# Patient Record
Sex: Female | Born: 1965 | Race: White | Hispanic: Yes | Smoking: Never smoker
Health system: Southern US, Community
[De-identification: ages and names within clinical notes are randomized; demographics above are authoritative.]

## PROBLEM LIST (undated history)

## (undated) DIAGNOSIS — I10 Essential (primary) hypertension: Secondary | ICD-10-CM

## (undated) HISTORY — DX: Essential (primary) hypertension: I10

---

## 1999-12-23 ENCOUNTER — Emergency Department (HOSPITAL_COMMUNITY): Admission: EM | Admit: 1999-12-23 | Discharge: 1999-12-23 | Payer: Self-pay | Admitting: *Deleted

## 2000-03-10 ENCOUNTER — Emergency Department (HOSPITAL_COMMUNITY): Admission: EM | Admit: 2000-03-10 | Discharge: 2000-03-10 | Payer: Self-pay

## 2001-03-21 ENCOUNTER — Emergency Department (HOSPITAL_COMMUNITY): Admission: EM | Admit: 2001-03-21 | Discharge: 2001-03-21 | Payer: Self-pay | Admitting: Emergency Medicine

## 2002-02-08 ENCOUNTER — Emergency Department (HOSPITAL_COMMUNITY): Admission: EM | Admit: 2002-02-08 | Discharge: 2002-02-08 | Payer: Self-pay | Admitting: Emergency Medicine

## 2002-02-22 ENCOUNTER — Other Ambulatory Visit: Admission: RE | Admit: 2002-02-22 | Discharge: 2002-02-22 | Payer: Self-pay | Admitting: Obstetrics and Gynecology

## 2003-10-10 ENCOUNTER — Inpatient Hospital Stay (HOSPITAL_COMMUNITY): Admission: AD | Admit: 2003-10-10 | Discharge: 2003-10-10 | Payer: Self-pay | Admitting: Gynecology

## 2004-07-31 ENCOUNTER — Emergency Department (HOSPITAL_COMMUNITY): Admission: EM | Admit: 2004-07-31 | Discharge: 2004-07-31 | Payer: Self-pay | Admitting: Emergency Medicine

## 2005-09-16 ENCOUNTER — Emergency Department (HOSPITAL_COMMUNITY): Admission: EM | Admit: 2005-09-16 | Discharge: 2005-09-17 | Payer: Self-pay | Admitting: Emergency Medicine

## 2005-12-17 ENCOUNTER — Emergency Department (HOSPITAL_COMMUNITY): Admission: EM | Admit: 2005-12-17 | Discharge: 2005-12-17 | Payer: Self-pay | Admitting: Emergency Medicine

## 2007-03-18 ENCOUNTER — Emergency Department (HOSPITAL_COMMUNITY): Admission: EM | Admit: 2007-03-18 | Discharge: 2007-03-18 | Payer: Self-pay | Admitting: Emergency Medicine

## 2008-01-22 ENCOUNTER — Emergency Department (HOSPITAL_COMMUNITY): Admission: EM | Admit: 2008-01-22 | Discharge: 2008-01-22 | Payer: Self-pay | Admitting: Emergency Medicine

## 2011-04-18 LAB — CBC
HCT: 39.8
Hemoglobin: 13.2
MCHC: 33.2
MCV: 77.7 — ABNORMAL LOW
RBC: 5.12 — ABNORMAL HIGH
RDW: 13.9

## 2011-04-18 LAB — URINALYSIS, ROUTINE W REFLEX MICROSCOPIC
Bilirubin Urine: NEGATIVE
Hgb urine dipstick: NEGATIVE
Ketones, ur: NEGATIVE
Specific Gravity, Urine: 1.003 — ABNORMAL LOW
pH: 7

## 2011-04-18 LAB — COMPREHENSIVE METABOLIC PANEL
AST: 22
Alkaline Phosphatase: 54
CO2: 28
Chloride: 99
GFR calc non Af Amer: >60
Glucose, Bld: 106 — ABNORMAL HIGH
Sodium: 135

## 2011-04-18 LAB — DIFFERENTIAL
Basophils Absolute: 0
Basophils Relative: 0
Lymphocytes Relative: 10 — ABNORMAL LOW
Lymphs Abs: 1.6
Neutro Abs: 13.3 — ABNORMAL HIGH
Neutrophils Relative %: 83 — ABNORMAL HIGH

## 2011-04-18 LAB — RAPID STREP SCREEN (MED CTR MEBANE ONLY): Streptococcus, Group A Screen (Direct): NEGATIVE

## 2011-04-18 LAB — URINE CULTURE: Culture: NO GROWTH

## 2015-06-07 ENCOUNTER — Ambulatory Visit (INDEPENDENT_AMBULATORY_CARE_PROVIDER_SITE_OTHER): Payer: 59 | Admitting: Family Medicine

## 2015-06-07 ENCOUNTER — Ambulatory Visit (INDEPENDENT_AMBULATORY_CARE_PROVIDER_SITE_OTHER): Payer: 59

## 2015-06-07 VITALS — BP 116/70 | HR 65 | Temp 98.3°F | Resp 18 | Ht 64.75 in | Wt 187.8 lb

## 2015-06-07 DIAGNOSIS — R531 Weakness: Secondary | ICD-10-CM | POA: Diagnosis not present

## 2015-06-07 DIAGNOSIS — R319 Hematuria, unspecified: Secondary | ICD-10-CM

## 2015-06-07 DIAGNOSIS — R760 Raised antibody titer: Secondary | ICD-10-CM | POA: Diagnosis not present

## 2015-06-07 DIAGNOSIS — J209 Acute bronchitis, unspecified: Secondary | ICD-10-CM | POA: Diagnosis not present

## 2015-06-07 DIAGNOSIS — M791 Myalgia, unspecified site: Secondary | ICD-10-CM

## 2015-06-07 DIAGNOSIS — R05 Cough: Secondary | ICD-10-CM | POA: Diagnosis not present

## 2015-06-07 DIAGNOSIS — I10 Essential (primary) hypertension: Secondary | ICD-10-CM

## 2015-06-07 DIAGNOSIS — R059 Cough, unspecified: Secondary | ICD-10-CM

## 2015-06-07 DIAGNOSIS — R768 Other specified abnormal immunological findings in serum: Secondary | ICD-10-CM

## 2015-06-07 LAB — POCT URINALYSIS DIP (MANUAL ENTRY)
Bilirubin, UA: NEGATIVE
Glucose, UA: NEGATIVE
Ketones, POC UA: NEGATIVE
Leukocytes, UA: NEGATIVE
Nitrite, UA: NEGATIVE
Protein Ur, POC: NEGATIVE
Spec Grav, UA: 1.025
Urobilinogen, UA: 0.2
pH, UA: 5

## 2015-06-07 LAB — POC MICROSCOPIC URINALYSIS (UMFC): Mucus: ABSENT

## 2015-06-07 MED ORDER — ALBUTEROL SULFATE 108 (90 BASE) MCG/ACT IN AEPB: 2.0000 | INHALATION_SPRAY | Freq: Four times a day (QID) | RESPIRATORY_TRACT | Status: DC | PRN

## 2015-06-07 MED ORDER — PREDNISONE 20 MG PO TABS: ORAL_TABLET | ORAL | Status: DC

## 2015-06-07 MED ORDER — AMLODIPINE BESYLATE 2.5 MG PO TABS: 2.5000 mg | ORAL_TABLET | Freq: Every day | ORAL | Status: DC

## 2015-06-07 MED ORDER — AZITHROMYCIN 250 MG PO TABS: ORAL_TABLET | ORAL | Status: DC

## 2015-06-07 MED ORDER — HYDROCODONE-HOMATROPINE 5-1.5 MG/5ML PO SYRP: 5.0000 mL | ORAL_SOLUTION | Freq: Three times a day (TID) | ORAL | Status: DC | PRN

## 2015-06-07 NOTE — Progress Notes (Signed)
 @  This chart was scribed for Tonya Sidle, MD by Tonya Saunders, ED Scribe. This patient was seen in room 8 and the patient's care was started at 9:14 PM.  Patient ID: Tonya Saunders MRN: [id number], DOB: 08/03/1965, 49 y.o. Date of Encounter: 06/07/2015, 9:14 PM  Primary Physician: No PCP Per Patient  Chief Complaint:   Chief Complaint  Patient presents with  . Cough    x2 months, cough is worse at night   . Fatigue    x2 months  . Burning Sensation    HPI: 49 y.o. year old female with history below presents with gradually worsening, violent cough for 3 month. Cough is worse at night and at time she coughs so hard that she vomits. She started taking left over amoxicillin last week every 8 hours but states she missed a couple of doses and tried restarting medication yesterday . She also reports fatigue as well as burning chest pain and back pain from coughing. She has hx of pneumonia.    at the end of the interview and as I was discharged to patient, she stated that she's been having some hematuria since her last period on October 22. She's having no discharge or pain.   She also complains about having had H. Pylori in the past and wants to make sure she is eradicated the infection.   Past Medical History  Diagnosis Date  . Hypertension      Home Meds: Prior to Admission medications   Medication Sig Start Date End Date Taking? Authorizing Provider  amoxicillin (AMOXIL) 500 MG capsule Take 500 mg by mouth 3 (three) times daily.   Yes Historical Provider, MD  enalapril (VASOTEC) 20 MG tablet Take 20 mg by mouth daily.   Yes Historical Provider, MD    Allergies: No Known Allergies  Social History   Social History  . Marital Status: Divorced    Spouse Name: Tonya Saunders  . Number of Children: Tonya Saunders  . Years of Education: Tonya Saunders   Occupational History  . Not on file.   Social History Main Topics  . Smoking status: Never Smoker   . Smokeless tobacco: Not on file  . Alcohol  Use: No  . Drug Use: No  . Sexual Activity: Not on file   Other Topics Concern  . Not on file   Social History Narrative  . No narrative on file    Review of Systems: Constitutional: negative for chills, fever, night sweats, weight changes, or fatigue  HEENT: negative for vision changes, hearing loss, congestion, rhinorrhea, ST, epistaxis, or sinus pressure Cardiovascular: negative for  palpitations Respiratory: negative for hemoptysis, wheezing, shortness of breath. Abdominal: negative for abdominal pain, nausea, vomiting, diarrhea, or constipation Dermatological: negative for rash Neurologic: negative for headache, dizziness, or syncope All other systems reviewed and are otherwise negative with the exception to those above and in the HPI.   Physical Exam: Blood pressure 116/70, pulse 65, temperature 98.3 F (36.8 C), temperature source Oral, resp. rate 18, height 5' 4.75" (1.645 m), weight 187 lb 12.8 oz (85.186 kg), last menstrual period 06/07/2015, SpO2 96 %., Body mass index is 31.48 kg/(m^2). General: Well developed, well nourished, in no acute distress. Head: Normocephalic, atraumatic, eyes without discharge, sclera non-icteric, nares are without discharge. Bilateral auditory canals clear, TM's are without perforation, pearly grey and translucent with reflective cone of light bilaterally. Oral cavity moist, posterior pharynx without exudate, erythema, peritonsillar abscess, or post nasal drip.  Neck: Supple. No thyromegaly. Full ROM.  No lymphadenopathy. Lungs: Clear bilaterally to auscultation without wheezes, rales, or rhonchi. Breathing is unlabored. Heart: RRR with S1 S2. No murmurs, rubs, or gallops appreciated. Abdomen: Soft, non-tender, non-distended with normoactive bowel sounds. No hepatomegaly. No rebound/guarding. No obvious abdominal masses. Msk:  Strength and tone normal for age. Extremities/Skin: Warm and dry. No clubbing or cyanosis. No edema. No rashes or  suspicious lesions. Neuro: Alert and oriented X 3. Moves all extremities spontaneously. Gait is normal. CNII-XII grossly in tact. Psych:  Responds to questions appropriately with a normal affect.   [facility] reading (PRIMARY) by Dr. Milus GlazierLauenstein: hyperinflated lung fields, no infiltrate   ASSESSMENT AND PLAN:  49 y.o. year old female with     ICD-9-CM ICD-10-CM   1. Cough 786.2 R05 DG Chest 2 View     Albuterol Sulfate (PROAIR RESPICLICK) 108 (90 BASE) MCG/ACT AEPB     predniSONE (DELTASONE) 20 MG tablet     azithromycin (ZITHROMAX) 250 MG tablet     HYDROcodone-homatropine (HYCODAN) 5-1.5 MG/5ML syrup  2. Myalgia 729.1 M79.1   3. Weakness 780.79 R53.1   4. Acute bronchitis, unspecified organism 466.0 J20.9   5. Hematuria 599.70 R31.9 POCT urinalysis dipstick     POCT Microscopic Urinalysis ([facility])     Urine culture  6. Essential hypertension 401.9 I10 amLODipine (NORVASC) 2.5 MG tablet  7. Helicobacter pylori ab+ 795.79 R76.0 H. pylori breath test   By signing my name below, I, Tonya Saunders, attest that this documentation has been prepared under the direction and in the presence of Tonya SidleKurt Liliani Bobo, MD.  Electronically Signed: Andrew Auaven Saunders, ED Scribe. 06/07/2015. 9:14 PM.  Signed, Tonya SidleKurt Teale Goodgame, MD 06/07/2015 9:14 PM

## 2015-06-07 NOTE — Patient Instructions (Signed)
Bronquitis aguda  (Acute Bronchitis)  La bronquitis es una inflamación de las vías respiratorias que se extienden desde la tráquea hasta los pulmones (bronquios). La inflamación produce la formación de mucosidad. Esto produce tos, que es el síntoma más frecuente de la bronquitis.   Cuando la bronquitis es aguda, generalmente comienza de manera súbita y desaparece luego de un par de semanas. El hábito de fumar, las alergias y el asma pueden empeorar la bronquitis. Los episodios repetidos de bronquitis pueden causar más problemas pulmonares.   CAUSAS  La causa más frecuente de bronquitis aguda es el mismo virus que produce el resfrío. El virus puede propagarse de una persona a la otra (contagioso) a través de la tos y los estornudos, y al tocar objetos contaminados.  SIGNOS Y SÍNTOMAS   · Tos.  · Fiebre.  · Tos con mucosidad.  · Dolores en el cuerpo.  · Congestión en el pecho.  · Escalofríos.  · Falta de aire.  · Dolor de garganta.  DIAGNÓSTICO   La bronquitis aguda en general se diagnostica con un examen físico. El médico también le hará preguntas sobre su historia clínica. En algunos casos se indican otros estudios, como radiografías, para descartar otras enfermedades.   TRATAMIENTO   La bronquitis aguda generalmente desaparece en un par de semanas. Con frecuencia, no es necesario realizar un tratamiento. Los medicamentos se indican para aliviar la fiebre o la tos. Generalmente, no es necesario el uso de antibióticos, pero pueden recetarse en ciertas ocasiones. En algunos casos, se recomienda el uso de un inhalador para mejorar la falta de aire y controlar la tos. Un vaporizador de aire frío podrá ayudarlo a disolver las secreciones bronquiales y facilitar su eliminación.   INSTRUCCIONES PARA EL CUIDADO EN EL HOGAR   · Descanse lo suficiente.  · Beba líquidos en abundancia para mantener la orina de color claro o amarillo pálido (excepto que padezca una enfermedad que requiera la restricción de líquidos). El aumento  de líquidos puede ayudar a que las secreciones respiratorias (esputo) sean menos espesas y a reducir la congestión del pecho, y evitará la deshidratación.  · Tome los medicamentos solamente como se lo haya indicado el médico.  · Si le recetaron antibióticos, asegúrese de terminarlos, incluso si comienza a sentirse mejor.  · Evite fumar o aspirar el humo de otros fumadores. La exposición al humo del cigarrillo o a irritantes químicos hará que la bronquitis empeore. Si fuma, considere el uso de goma de mascar o la aplicación de parches en la piel que contengan nicotina para aliviar los síntomas de abstinencia. Si deja de fumar, sus pulmones se curarán más rápido.  · Reduzca la probabilidad de otro episodio de bronquitis aguda lavando sus manos con frecuencia, evitando a las personas que tengan síntomas y tratando de no tocarse las manos con la boca, la nariz o los ojos.  · Concurra a todas las visitas de control como se lo haya indicado el médico.  SOLICITE ATENCIÓN MÉDICA SI:  Los síntomas no mejoran después de una semana de tratamiento.   SOLICITE ATENCIÓN MÉDICA DE INMEDIATO SI:  · Comienza a tener fiebre o escalofríos cada vez más intensos.  · Siente dolor en el pecho.  · Le falta el aire de manera preocupante.  · La flema tiene sangre.  · Se deshidrata.  · Se desmaya o siente que va a desmayarse de forma repetida.  · Tiene vómitos que se repiten.  · Tiene un dolor de cabeza intenso.  ASEGÚRESE DE QUE:   ·   Comprende estas instrucciones.  · Controlará su afección.  · Recibirá ayuda de inmediato si no mejora o si empeora.     Esta información no tiene como fin reemplazar el consejo del médico. Asegúrese de hacerle al médico cualquier pregunta que tenga.     Document Released: 07/08/2005 Document Revised: 07/29/2014  Elsevier Interactive Patient Education ©2016 Elsevier Inc.

## 2015-06-09 LAB — H. PYLORI BREATH TEST: H. pylori Breath Test: NOT DETECTED

## 2015-06-09 LAB — URINE CULTURE
Colony Count: NO GROWTH
Organism ID, Bacteria: NO GROWTH

## 2015-06-14 ENCOUNTER — Ambulatory Visit (INDEPENDENT_AMBULATORY_CARE_PROVIDER_SITE_OTHER): Payer: 59

## 2015-06-14 ENCOUNTER — Ambulatory Visit (INDEPENDENT_AMBULATORY_CARE_PROVIDER_SITE_OTHER): Payer: 59 | Admitting: Family Medicine

## 2015-06-14 VITALS — BP 122/80 | HR 74 | Temp 98.3°F | Resp 18 | Ht 65.0 in | Wt 185.8 lb

## 2015-06-14 DIAGNOSIS — K5901 Slow transit constipation: Secondary | ICD-10-CM | POA: Diagnosis not present

## 2015-06-14 DIAGNOSIS — R358 Other polyuria: Secondary | ICD-10-CM | POA: Diagnosis not present

## 2015-06-14 DIAGNOSIS — R1013 Epigastric pain: Secondary | ICD-10-CM

## 2015-06-14 DIAGNOSIS — R079 Chest pain, unspecified: Secondary | ICD-10-CM

## 2015-06-14 DIAGNOSIS — R3589 Other polyuria: Secondary | ICD-10-CM

## 2015-06-14 LAB — POCT URINALYSIS DIP (MANUAL ENTRY)
Bilirubin, UA: NEGATIVE
Glucose, UA: NEGATIVE
Ketones, POC UA: NEGATIVE
Nitrite, UA: NEGATIVE
Protein Ur, POC: NEGATIVE
Spec Grav, UA: 1.015
Urobilinogen, UA: 0.2
pH, UA: 8

## 2015-06-14 LAB — POCT CBC
Granulocyte percent: 88.5 %G — AB (ref 37–80)
HCT, POC: 41.8 % (ref 37.7–47.9)
Hemoglobin: 13.6 g/dL (ref 12.2–16.2)
Lymph, poc: 1 (ref 0.6–3.4)
MCH, POC: 25.1 pg — AB (ref 27–31.2)
MCHC: 32.5 g/dL (ref 31.8–35.4)
MCV: 77 fL — AB (ref 80–97)
MID (cbc): 0.5 (ref 0–0.9)
MPV: 7.9 fL (ref 0–99.8)
POC Granulocyte: 11.4 — AB (ref 2–6.9)
POC LYMPH PERCENT: 7.6 %L — AB (ref 10–50)
POC MID %: 3.9 %M (ref 0–12)
Platelet Count, POC: 355 10*3/uL (ref 142–424)
RBC: 5.43 M/uL (ref 4.04–5.48)
RDW, POC: 14.3 %
WBC: 12.9 10*3/uL — AB (ref 4.6–10.2)

## 2015-06-14 LAB — POC MICROSCOPIC URINALYSIS (UMFC): Mucus: ABSENT

## 2015-06-14 MED ORDER — SUCRALFATE 1 GM/10ML PO SUSP: 1.0000 g | Freq: Two times a day (BID) | ORAL | Status: DC

## 2015-06-14 MED ORDER — GI COCKTAIL ~~LOC~~
30.0000 mL | Freq: Once | ORAL | Status: AC
  Administered 2015-06-14: 30 mL via ORAL

## 2015-06-14 MED ORDER — POLYETHYLENE GLYCOL 3350 17 GM/SCOOP PO POWD: 17.0000 g | Freq: Two times a day (BID) | ORAL | Status: DC | PRN

## 2015-06-14 NOTE — Progress Notes (Addendum)
This chart was scribed for Tonya Sidle, MD by Tonya Saunders, medical scribe at [facility].The patient was seen in exam room 1 and the patient's care was started at 5:01 PM.  Patient ID: [name] MRN: [id number], DOB: Apr 17, 1966, 49 y.o. Date of Encounter: 06/14/2015  Primary Physician: No PCP Per Patient  Chief Complaint:  Chief Complaint  Patient presents with  . Follow-up     lab work,    HPI:  Tonya Saunders is a 49 y.o. female who presents to [facility] for lab work follow up.   She noticed some chest pain last night and she can't breathe very well. Her arms feel weak with some abd pain that feels like "heart burn". She feels nauseous in the room today. It hurts more when she's lying down. She's been concerned and has been reading about this. She is concerned that this feels like a heart attack. She also states that she's been urinating more frequently than usual.   She was previously given medication for heart burn, however, patient states she doesn't know how to use it.   Past Medical History  Diagnosis Date  . Hypertension      Home Meds: Prior to Admission medications   Medication Sig Start Date End Date Taking? Authorizing Provider  amLODipine (NORVASC) 2.5 MG tablet Take 1 tablet (2.5 mg total) by mouth daily. 06/07/15  Yes Tonya Sidle, MD  azithromycin (ZITHROMAX) 250 MG tablet Take 2 tabs PO x 1 dose, then 1 tab PO QD x 4 days 06/07/15  Yes Tonya Sidle, MD  HYDROcodone-homatropine [facility]) 5-1.5 MG/5ML syrup Take 5 mLs by mouth every 8 (eight) hours as needed for cough. 06/07/15  Yes Tonya Sidle, MD  predniSONE (DELTASONE) 20 MG tablet Two daily with food 06/07/15  Yes Tonya Sidle, MD  Albuterol Sulfate (PROAIR RESPICLICK) 108 (90 BASE) MCG/ACT AEPB Inhale 2 puffs into the lungs every 6 (six) hours as needed. Patient not taking: Reported on 06/14/2015 06/07/15   Tonya Sidle, MD    Allergies: No  Known Allergies  Social History   Social History  . Marital Status: Divorced    Spouse Name: N/A  . Number of Children: N/A  . Years of Education: N/A   Occupational History  . Not on file.   Social History Main Topics  . Smoking status: Never Smoker   . Smokeless tobacco: Not on file  . Alcohol Use: No  . Drug Use: No  . Sexual Activity: Not on file   Other Topics Concern  . Not on file   Social History Narrative     Review of Systems: Constitutional: negative for fever, chills, night sweats, weight changes; positive for fatigue  HEENT: negative for vision changes, hearing loss, congestion, rhinorrhea, ST, epistaxis, or sinus pressure Cardiovascular: negative for palpitations; positive for chest pain Respiratory: negative for hemoptysis, wheezing, shortness of breath, or cough Abdominal: negative for vomiting, diarrhea, or constipation; positive for abd pain, nausea Dermatological: negative for rash Neurologic: negative for headache, dizziness, or syncope; positive for weakness (upper extremity) GU: positive for urinary frequency All other systems reviewed and are otherwise negative with the exception to those above and in the HPI.  Physical Exam: Blood pressure 122/80, pulse 74, temperature 98.3 F (36.8 C), temperature source Oral, resp. rate 18, height  (1.651 m), weight 185 lb 12.8 oz (84.278 kg), last menstrual period 06/07/2015, SpO2 99 %., Body mass index is 30.92 kg/(m^2). General: Well  developed, well nourished, in no acute distress. Head: Normocephalic, atraumatic, eyes without discharge, sclera non-icteric, nares are without discharge. Bilateral auditory canals clear, TM's are without perforation, pearly grey and translucent with reflective cone of light bilaterally. Oral cavity moist, posterior pharynx without exudate, erythema, peritonsillar abscess, or post nasal drip.  Neck: Supple. No thyromegaly. Full ROM. No lymphadenopathy. Lungs: Clear bilaterally  to auscultation without wheezes, rales, or rhonchi. Breathing is unlabored. Heart: RRR with S1 S2. No murmurs, rubs, or gallops appreciated. Abdomen: Soft, non-tender, non-distended with normoactive bowel sounds. No hepatomegaly. No rebound/guarding. No obvious abdominal masses.  Msk:  Strength and tone normal for age. Extremities/Skin: Warm and dry. No clubbing or cyanosis. No edema. No rashes or suspicious lesions. Neuro: Alert and oriented X 3. Moves all extremities spontaneously. Gait is normal. CNII-XII grossly in tact. Psych:  Responds to questions appropriately with a normal affect.   Labs: Results for orders placed or performed in visit on 06/14/15  POCT urinalysis dipstick  Result Value Ref Range   Color, UA yellow yellow   Clarity, UA clear clear   Glucose, UA negative negative   Bilirubin, UA negative negative   Ketones, POC UA negative negative   Spec Grav, UA 1.015    Blood, UA moderate (A) negative   pH, UA 8.0    Protein Ur, POC negative negative   Urobilinogen, UA 0.2    Nitrite, UA Negative Negative   Leukocytes, UA Trace (A) Negative  POCT Microscopic Urinalysis ([facility])  Result Value Ref Range   WBC,UR,HPF,POC Few (A) None WBC/hpf   RBC,UR,HPF,POC Moderate (A) None RBC/hpf   Bacteria None None, Too numerous to count   Mucus Absent Absent   Epithelial Cells, UR Per Microscopy Few (A) None, Too numerous to count cells/hpf  POCT CBC  Result Value Ref Range   WBC 12.9 (A) 4.6 - 10.2 K/uL   Lymph, poc 1.0 0.6 - 3.4   POC LYMPH PERCENT 7.6 (A) 10 - 50 %L   MID (cbc) 0.5 0 - 0.9   POC MID % 3.9 0 - 12 %M   POC Granulocyte 11.4 (A) 2 - 6.9   Granulocyte percent 88.5 (A) 37 - 80 %G   RBC 5.43 4.04 - 5.48 M/uL   Hemoglobin 13.6 12.2 - 16.2 g/dL   HCT, POC 45.441.8 09.837.7 - 47.9 %   MCV 77.0 (A) 80 - 97 fL   MCH, POC 25.1 (A) 27 - 31.2 pg   MCHC 32.5 31.8 - 35.4 g/dL   RDW, POC 11.914.3 %   Platelet Count, POC 355 142 - 424 K/uL   MPV 7.9 0 - 99.8 fL   EKG:  NSR [facility]  reading (PRIMARY) by  Dr. Milus GlazierLauenstein:  Normal CXR.   ASSESSMENT AND PLAN:  49 y.o. year old female with  This chart was scribed in my presence and reviewed by me personally.    ICD-9-CM ICD-10-CM   1. Abdominal pain, epigastric 789.06 R10.13 POCT CBC     BASIC METABOLIC PANEL WITH GFR     Amylase     DG Chest 2 View     EKG 12-Lead     gi cocktail (Maalox,Lidocaine,Donnatal)     sucralfate (CARAFATE) 1 GM/10ML suspension     Ambulatory referral to Gastroenterology  2. Chest pain, unspecified chest pain type 786.50 R07.9 POCT CBC     BASIC METABOLIC PANEL WITH GFR     Amylase     DG Chest 2 View     EKG  12-Lead     gi cocktail (Maalox,Lidocaine,Donnatal)     sucralfate (CARAFATE) 1 GM/10ML suspension  3. Polyuria 788.42 R35.8 POCT urinalysis dipstick     POCT Microscopic Urinalysis ([facility])     gi cocktail (Maalox,Lidocaine,Donnatal)  4. Slow transit constipation 564.01 K59.01 polyethylene glycol powder (GLYCOLAX/MIRALAX) powder     Ambulatory referral to Gastroenterology     By signing my name below, I, Tonya Saunders, attest that this documentation has been prepared under the direction and in the presence of Tonya Sidle, MD. Electronically Signed: Stann Saunders, Scribe. 06/14/2015 , 6:09 PM .  Signed, Tonya Sidle, MD 06/14/2015 6:09 PM

## 2015-06-16 LAB — BASIC METABOLIC PANEL WITH GFR
BUN: 13 mg/dL (ref 7–25)
CO2: 29 mmol/L (ref 20–31)
Calcium: 9.8 mg/dL (ref 8.6–10.2)
Chloride: 98 mmol/L (ref 98–110)
Creat: 0.88 mg/dL (ref 0.50–1.10)
GFR, Est African American: >89 mL/min (ref >=60)
GFR, Est Non African American: 78 mL/min (ref >=60)
Glucose, Bld: 113 mg/dL — ABNORMAL HIGH (ref 65–99)
Potassium: 4.6 mmol/L (ref 3.5–5.3)
Sodium: 136 mmol/L (ref 135–146)

## 2015-06-16 LAB — AMYLASE: Amylase: 31 U/L (ref 0–105)

## 2015-06-17 ENCOUNTER — Other Ambulatory Visit: Payer: Self-pay | Admitting: Family Medicine

## 2015-06-17 DIAGNOSIS — R1013 Epigastric pain: Principal | ICD-10-CM

## 2015-06-17 DIAGNOSIS — G8929 Other chronic pain: Secondary | ICD-10-CM

## 2015-06-21 ENCOUNTER — Encounter: Payer: Self-pay | Admitting: Gastroenterology

## 2015-08-21 ENCOUNTER — Ambulatory Visit: Payer: Self-pay | Admitting: Gastroenterology

## 2016-01-03 ENCOUNTER — Ambulatory Visit (INDEPENDENT_AMBULATORY_CARE_PROVIDER_SITE_OTHER): Payer: BLUE CROSS/BLUE SHIELD | Admitting: Family Medicine

## 2016-01-03 VITALS — BP 128/80 | HR 67 | Temp 97.9°F | Resp 16 | Ht 64.0 in | Wt 185.4 lb

## 2016-01-03 DIAGNOSIS — R079 Chest pain, unspecified: Secondary | ICD-10-CM

## 2016-01-03 DIAGNOSIS — R5382 Chronic fatigue, unspecified: Secondary | ICD-10-CM | POA: Diagnosis not present

## 2016-01-03 DIAGNOSIS — R059 Cough, unspecified: Secondary | ICD-10-CM

## 2016-01-03 DIAGNOSIS — R300 Dysuria: Secondary | ICD-10-CM | POA: Diagnosis not present

## 2016-01-03 DIAGNOSIS — R05 Cough: Secondary | ICD-10-CM | POA: Diagnosis not present

## 2016-01-03 DIAGNOSIS — K219 Gastro-esophageal reflux disease without esophagitis: Secondary | ICD-10-CM | POA: Diagnosis not present

## 2016-01-03 DIAGNOSIS — R1013 Epigastric pain: Secondary | ICD-10-CM | POA: Diagnosis not present

## 2016-01-03 LAB — BASIC METABOLIC PANEL WITH GFR
BUN: 13 mg/dL (ref 7–25)
CO2: 25 mmol/L (ref 20–31)
Calcium: 9.4 mg/dL (ref 8.6–10.2)
Chloride: 100 mmol/L (ref 98–110)
Creat: 0.86 mg/dL (ref 0.50–1.10)
GFR, Est African American: >89 mL/min (ref >=60)
GFR, Est Non African American: 80 mL/min (ref >=60)
Glucose, Bld: 86 mg/dL (ref 65–99)
Potassium: 4.6 mmol/L (ref 3.5–5.3)
Sodium: 135 mmol/L (ref 135–146)

## 2016-01-03 LAB — POC MICROSCOPIC URINALYSIS (UMFC): Mucus: ABSENT

## 2016-01-03 LAB — POCT CBC
Granulocyte percent: 75.1 %G (ref 37–80)
HCT, POC: 38.1 % (ref 37.7–47.9)
Hemoglobin: 12.9 g/dL (ref 12.2–16.2)
Lymph, poc: 2 (ref 0.6–3.4)
MCH, POC: 25.3 pg — AB (ref 27–31.2)
MCHC: 33.8 g/dL (ref 31.8–35.4)
MCV: 74.7 fL — AB (ref 80–97)
MID (cbc): 0.5 (ref 0–0.9)
MPV: 8.3 fL (ref 0–99.8)
POC Granulocyte: 7.7 — AB (ref 2–6.9)
POC LYMPH PERCENT: 19.9 %L (ref 10–50)
POC MID %: 5 %M (ref 0–12)
Platelet Count, POC: 262 10*3/uL (ref 142–424)
RBC: 5.09 M/uL (ref 4.04–5.48)
RDW, POC: 14.7 %
WBC: 10.2 10*3/uL (ref 4.6–10.2)

## 2016-01-03 LAB — POCT URINALYSIS DIP (MANUAL ENTRY)
Bilirubin, UA: NEGATIVE
Blood, UA: NEGATIVE
Glucose, UA: NEGATIVE
Ketones, POC UA: NEGATIVE
Nitrite, UA: NEGATIVE
Protein Ur, POC: NEGATIVE
Spec Grav, UA: 1.015
Urobilinogen, UA: 0.2
pH, UA: 5.5

## 2016-01-03 MED ORDER — HYDROCOD POLST-CPM POLST ER 10-8 MG/5ML PO SUER: 5.0000 mL | Freq: Every evening | ORAL | Status: DC | PRN

## 2016-01-03 MED ORDER — NITROFURANTOIN MONOHYD MACRO 100 MG PO CAPS: 100.0000 mg | ORAL_CAPSULE | Freq: Two times a day (BID) | ORAL | Status: DC

## 2016-01-03 MED ORDER — SUCRALFATE 1 GM/10ML PO SUSP
1.0000 g | Freq: Two times a day (BID) | ORAL | Status: DC
Start: 2016-01-03 — End: 2016-02-13

## 2016-01-03 MED ORDER — ALBUTEROL SULFATE 108 (90 BASE) MCG/ACT IN AEPB: 2.0000 | INHALATION_SPRAY | Freq: Four times a day (QID) | RESPIRATORY_TRACT | Status: DC | PRN

## 2016-01-03 NOTE — Patient Instructions (Signed)
I am refilling your medicine for acid reduction which should help with the cough. I'm also prescribing an antibiotic for urinary infection.  I'm waiting for your thyroid test to come back to see if the thyroid is causing the chronic fatigue.

## 2016-01-03 NOTE — Progress Notes (Signed)
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 Chronic cough x 5 months with minimal phlegm, no shortness of bbreath, no fever and mild improvement with inhaler 2. Intermittent substernal tightness and heart burn 3. dysuria Objective:  BP 128/80 mmHg  Pulse 67  Temp(Src) 97.9 F (36.6 C) (Oral)  Resp 16  Ht 5\' 4"  (1.626 m)  Wt 185 lb 6.4 oz (84.097 kg)  BMI 31.81 kg/m2  SpO2 98%  LMP 12/09/2015 HEENT: unremarkable Chest:  Clear Heart:  Reg, no murmur or gallop Skin:  No rash  Results for orders placed or performed in visit on 01/03/16  POCT CBC  Result Value Ref Range   WBC 10.2 4.6 - 10.2 K/uL   Lymph, poc 2.0 0.6 - 3.4   POC LYMPH PERCENT 19.9 10 - 50 %L   MID (cbc) 0.5 0 - 0.9   POC MID % 5.0 0 - 12 %M   POC Granulocyte 7.7 (A) 2 - 6.9   Granulocyte percent 75.1 37 - 80 %G   RBC 5.09 4.04 - 5.48 M/uL   Hemoglobin 12.9 12.2 - 16.2 g/dL   HCT, POC 16.138.1 09.637.7 - 47.9 %   MCV 74.7 (A) 80 - 97 fL   MCH, POC 25.3 (A) 27 - 31.2 pg   MCHC 33.8 31.8 - 35.4 g/dL   RDW, POC 04.514.7 %   Platelet Count, POC 262 142 - 424 K/uL   MPV 8.3 0 - 99.8 fL  POCT urinalysis dipstick  Result Value Ref Range   Color, UA yellow yellow   Clarity, UA clear clear   Glucose, UA negative negative   Bilirubin, UA negative negative   Ketones, POC UA negative negative   Spec Grav, UA 1.015    Blood, UA negative negative   pH, UA 5.5    Protein Ur, POC negative negative   Urobilinogen, UA 0.2    Nitrite, UA Negative Negative   Leukocytes, UA small (1+) (A) Negative  POCT Microscopic Urinalysis ([facility])  Result Value Ref Range   WBC,UR,HPF,POC None None WBC/hpf   RBC,UR,HPF,POC None None RBC/hpf   Bacteria None None, Too numerous to count   Mucus Absent Absent   Epithelial Cells, UR Per Microscopy Few (A) None, Too numerous to count cells/hpf   Assessment:There are features of reflux that could be contributing to her cough and substernal pain. A chronic fatigue is a little  more difficult to explain.      ICD-9-CM ICD-10-CM   1. Chronic fatigue 780.79 R53.82 POCT CBC     BASIC METABOLIC PANEL WITH GFR     TSH  2. Cough 786.2 R05 POCT CBC     Albuterol Sulfate (PROAIR RESPICLICK) 108 (90 Base) MCG/ACT AEPB     chlorpheniramine-HYDROcodone (TUSSIONEX PENNKINETIC ER) 10-8 MG/5ML SUER  3. Gastroesophageal reflux disease, esophagitis presence not specified 530.81 K21.9 sucralfate (CARAFATE) 1 GM/10ML suspension  4. Abdominal pain, epigastric 789.06 R10.13 sucralfate (CARAFATE) 1 GM/10ML suspension  5. Chest pain, unspecified chest pain type 786.50 R07.9 sucralfate (CARAFATE) 1 GM/10ML suspension  6. Dysuria 788.1 R30.0 POCT urinalysis dipstick     POCT Microscopic Urinalysis ([facility])     Urine culture     nitrofurantoin, macrocrystal-monohydrate, (MACROBID) 100 MG capsule     Signed, Elvina SidleKurt Lakyn Mantione, MD

## 2016-01-04 ENCOUNTER — Telehealth: Payer: Self-pay | Admitting: Emergency Medicine

## 2016-01-04 ENCOUNTER — Other Ambulatory Visit: Payer: Self-pay | Admitting: Family Medicine

## 2016-01-04 DIAGNOSIS — R053 Chronic cough: Secondary | ICD-10-CM

## 2016-01-04 DIAGNOSIS — R05 Cough: Secondary | ICD-10-CM

## 2016-01-04 LAB — TSH: TSH: 1.2 mIU/L

## 2016-01-04 NOTE — Telephone Encounter (Signed)
-----   Message from Elvina SidleKurt Lauenstein, MD sent at 01/04/2016 11:44 AM EDT ----- Please inform patient of normal result

## 2016-01-04 NOTE — Telephone Encounter (Signed)
Pt given normal blood results 

## 2016-01-05 ENCOUNTER — Telehealth: Payer: Self-pay

## 2016-01-05 NOTE — Telephone Encounter (Signed)
Pharm sent notice that sucralfate suspension is not covered by insurance. Called pt to see if she has ever tried the tablets, and she reported that she has not. I advised her that I would check w/pharm to see if ins will cover it in tablet form. Called pharm who changed Rx to the tablet form and it was covered by ins w/payment of $6. I advised pt that if the tablets do not work as well as the suspension to call me and at that point I might be able to get ins to cover it (after failing tablets).

## 2016-02-13 ENCOUNTER — Ambulatory Visit (INDEPENDENT_AMBULATORY_CARE_PROVIDER_SITE_OTHER): Payer: BLUE CROSS/BLUE SHIELD | Admitting: Internal Medicine

## 2016-02-13 ENCOUNTER — Encounter: Payer: Self-pay | Admitting: Internal Medicine

## 2016-02-13 VITALS — BP 142/80 | HR 54 | Ht 68.0 in | Wt 185.4 lb

## 2016-02-13 DIAGNOSIS — R05 Cough: Secondary | ICD-10-CM

## 2016-02-13 DIAGNOSIS — R058 Other specified cough: Secondary | ICD-10-CM | POA: Insufficient documentation

## 2016-02-13 MED ORDER — PANTOPRAZOLE SODIUM 40 MG PO TBEC: 40.0000 mg | DELAYED_RELEASE_TABLET | Freq: Every day | ORAL | 2 refills | Status: AC

## 2016-02-13 MED ORDER — FAMOTIDINE 20 MG PO TABS: ORAL_TABLET | ORAL | 2 refills | Status: AC

## 2016-02-13 MED ORDER — TRAMADOL HCL 50 MG PO TABS: ORAL_TABLET | ORAL | 0 refills | Status: AC

## 2016-02-13 MED ORDER — METHYLPREDNISOLONE ACETATE 80 MG/ML IJ SUSP
120.0000 mg | Freq: Once | INTRAMUSCULAR | Status: AC
Start: 2016-02-13 — End: 2016-02-13
  Administered 2016-02-13: 120 mg via INTRAMUSCULAR

## 2016-02-13 NOTE — Progress Notes (Signed)
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 Pulmonary Consult    Referred by Dr. Milus Glazier. Pt c/o CP, SOB and cough with clear sputum off and on for the past month. Her cough is esp worse at night.   cough x 03/2015 worse at hs sometimes wakes her up around 2am and not typically at time to get up / occ coughs  so hard she vomits ? Some better from inhalers but really minimal sputum production and denies  purulent sputum or mucus plugs or h/o hemoptysis   Also with urinary incont with cough  Assoc midline Ant dull  Cp lasts from 1 min / longest 1 h worse with cough/sometime when lies down has it even if not coughing   Also lat cp with cough both flanks  No obvious other patterns in day to day or daytime variabilty or assoc  or chest tightness, subjective wheeze overt sinus or hb symptoms. No unusual exp hx or h/o childhood pna/ asthma or knowledge of premature birth.   Also denies any obvious fluctuation of symptoms with weather or environmental changes or other aggravating or alleviating factors except as outlined above   Current Medications, Allergies, Complete Past Medical History, Past Surgical History, Family History, and Social History were reviewed in [employer] record.                Review of Systems  Constitutional: Negative for chills, fever and unexpected weight change.  HENT: Positive for congestion, sneezing, sore throat and voice change. Negative for dental problem, ear pain, nosebleeds, postnasal drip, rhinorrhea, sinus pressure and trouble swallowing.   Eyes: Negative for visual disturbance.  Respiratory: Positive for cough and choking.  Negative for shortness of breath.   Cardiovascular: Positive for chest pain. Negative for leg swelling.  Gastrointestinal: Negative for abdominal pain, diarrhea and vomiting.       Acid heartburn  Genitourinary: Negative for difficulty urinating.  Musculoskeletal: Negative for arthralgias.  Skin: Negative for rash.  Neurological: Positive for headaches. Negative for tremors and syncope.  Hematological: Does not bruise/bleed easily.       Objective:   Physical Exam  amb latino hoarse  female limited English  Wt Readings from Last 3 Encounters:  02/13/16 185 lb 6.4 oz (84.1 kg)  01/03/16 185 lb 6.4 oz (84.1 kg)  06/14/15 185 lb 12.8 oz (84.3 kg)    Vital signs reviewed   HEENT: nl dentition, turbinates, and oropharynx. Nl external ear canals without cough reflex   NECK :  without JVD/Nodes/TM/ nl carotid upstrokes bilaterally   LUNGS: no acc muscle use,  Nl contour chest which is clear to A and P bilaterally without cough on insp or exp maneuvers   CV:  RRR  no s3 or murmur or increase in P2, no edema   ABD:  soft and nontender with nl inspiratory excursion in the supine position. No bruits or organomegaly, bowel sounds nl  MS:  Nl gait/ ext warm without deformities, calf tenderness, cyanosis or clubbing No obvious joint restrictions   SKIN: warm and dry without lesions    NEURO:  alert, approp, nl sensorium with  no motor deficits    Did not go for labs/cxr as rec  CXR PA and Lateral:   02/13/2016 :    I personally reviewed images and agree with radiology impression as follows:      Labs ordered 02/13/2016 :  Cbc with eos/ allergy profile        Assessment & Plan:

## 2016-02-13 NOTE — Assessment & Plan Note (Addendum)
The most common causes of chronic cough in immunocompetent adults include the following: upper airway cough syndrome (UACS), previously referred to as postnasal drip syndrome (PNDS), which is caused by variety of rhinosinus conditions; (2) asthma; (3) GERD; (4) chronic bronchitis from cigarette smoking or other inhaled environmental irritants; (5) nonasthmatic eosinophilic bronchitis; and (6) bronchiectasis.   These conditions, singly or in combination, have accounted for up to 94% of the causes of chronic cough in prospective studies.   Other conditions have constituted no >6% of the causes in prospective studies These have included bronchogenic carcinoma, chronic interstitial pneumonia, sarcoidosis, left ventricular failure, ACEI-induced cough, and aspiration from a condition associated with pharyngeal dysfunction.    Chronic cough is often simultaneously caused by more than one condition. A single cause has been found from 38 to 82% of the time, multiple causes from 18 to 62%. Multiply caused cough has been the result of three diseases up to 42% of the time.       Based on hx and exam, this is most likely:  Classic Upper airway cough syndrome, so named because it's frequently impossible to sort out how much is  CR/sinusitis with freq throat clearing (which can be related to primary GERD)   vs  causing  secondary (" extra esophageal")  GERD from wide swings in gastric pressure that occur with throat clearing, often  promoting self use of mint and menthol lozenges that reduce the lower esophageal sphincter tone and exacerbate the problem further in a cyclical fashion.   These are the same pts (now being labeled as having "irritable larynx syndrome" by some cough centers) who not infrequently have a history of having failed to tolerate ace inhibitors,  dry powder inhalers or biphosphonates or report having atypical reflux symptoms that don't respond to standard doses of PPI , and are easily confused as  having aecopd or asthma flares by even experienced allergists/ pulmonologists.   The first step is to maximize acid suppression and eliminate cyclical coughing then regroup in 4 weeks.  Total time devoted to counseling  = 35/79m review case with pt/husband  discussion of options/alternatives/ personally creating written instructions  in presence of pt  then going over those specific  Instructions directly with the pt including how to use all of the meds but in particular covering each new medication in detail and the difference between the maintenance/automatic meds and the prns using an action plan format for the latter.    Note did not go for cxr/ labs will ask her to return if not doing better before returns for 4 week visit

## 2016-02-13 NOTE — Patient Instructions (Addendum)
Pantoprazole (protonix) 40 mg   Take  30-60 min before first meal of the day and Pepcid (famotidine)  20 mg one @  bedtime until return to office - this is the best way to tell whether stomach acid is contributing to your problem.    GERD (REFLUX)  is an extremely common cause of respiratory symptoms just like yours , many times with no obvious heartburn at all.    It can be treated with medication, but also with lifestyle changes including elevation of the head of your bed (ideally with 6 inch  bed blocks),  Smoking cessation, avoidance of late meals, excessive alcohol, and avoid fatty foods, chocolate, peppermint, colas, red wine, and acidic juices such as orange juice.  NO MINT OR MENTHOL PRODUCTS SO NO COUGH DROPS   USE SUGARLESS CANDY INSTEAD (Jolley ranchers or Stover's or Life Savers) or even ice chips will also do - the key is to swallow to prevent all throat clearing. NO OIL BASED VITAMINS - use powdered substitutes.    Take delsym two tsp every 12 hours and supplement if needed with  tramadol 50 mg up to 2 every 4 hours to suppress the urge to cough. Swallowing water or using ice chips/non mint and menthol containing candies (such as lifesavers or sugarless jolly ranchers) are also effective.  You should rest your voice and avoid activities that you know make you cough.  Once you have eliminated the cough for 3 straight days try reducing the tramadol first,  then the delsym as tolerated.     Please remember to go to the lab and x-ray department downstairs for your tests - we will call you with the results when they are available.     Please schedule a follow up office visit in 4 weeks, sooner if needed with all active medications in hand including over the counter meds  Note did not go for cxr or labs

## 2016-03-12 ENCOUNTER — Ambulatory Visit: Payer: BLUE CROSS/BLUE SHIELD | Admitting: Internal Medicine

## 2016-06-03 ENCOUNTER — Encounter: Payer: Self-pay | Admitting: Family Medicine

## 2016-06-06 ENCOUNTER — Other Ambulatory Visit: Payer: Self-pay | Admitting: Family Medicine

## 2016-06-06 DIAGNOSIS — I10 Essential (primary) hypertension: Secondary | ICD-10-CM

## 2016-07-11 ENCOUNTER — Other Ambulatory Visit: Payer: Self-pay | Admitting: Family Medicine

## 2016-07-11 DIAGNOSIS — I10 Essential (primary) hypertension: Secondary | ICD-10-CM

## 2017-02-11 IMAGING — CR DG CHEST 2V
2 series · 2 of 2 positions shown · non-contrast
Comparison: January 22, 2008.

CLINICAL DATA: Cough.

EXAM:
CHEST  2 VIEW

[PA]
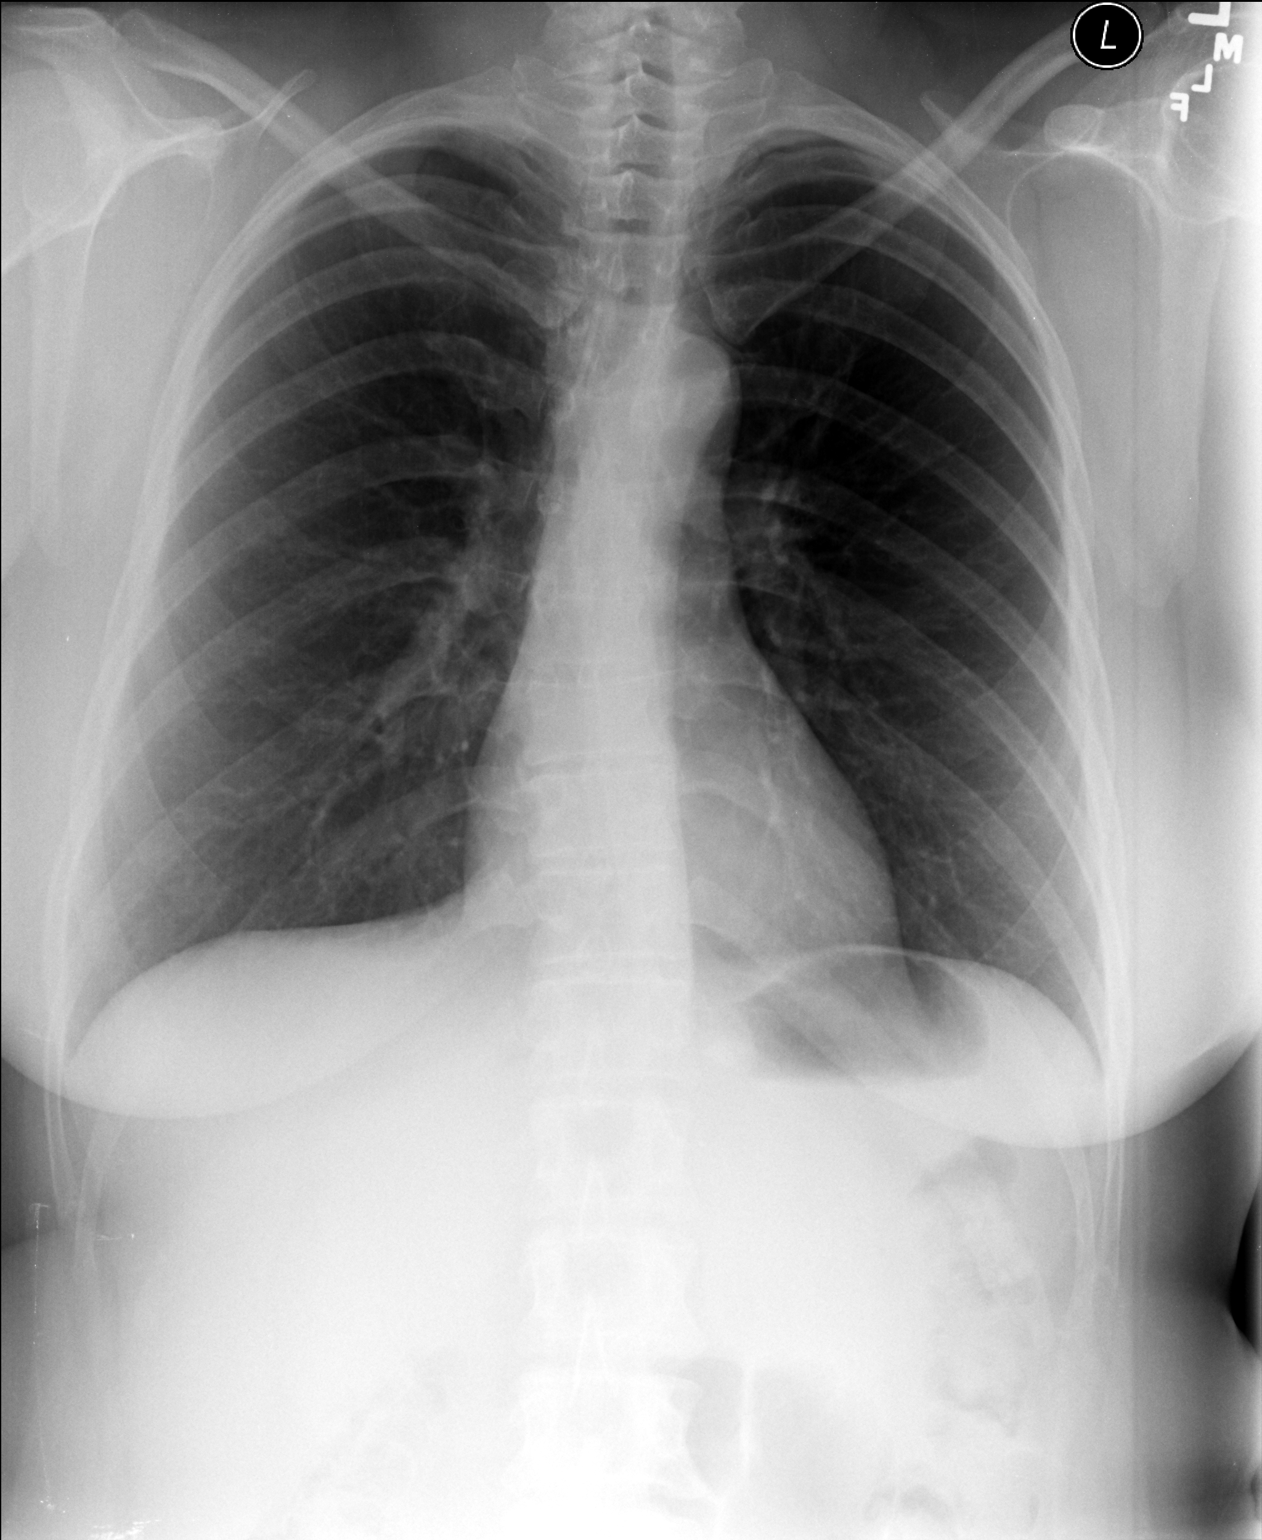

[lateral]
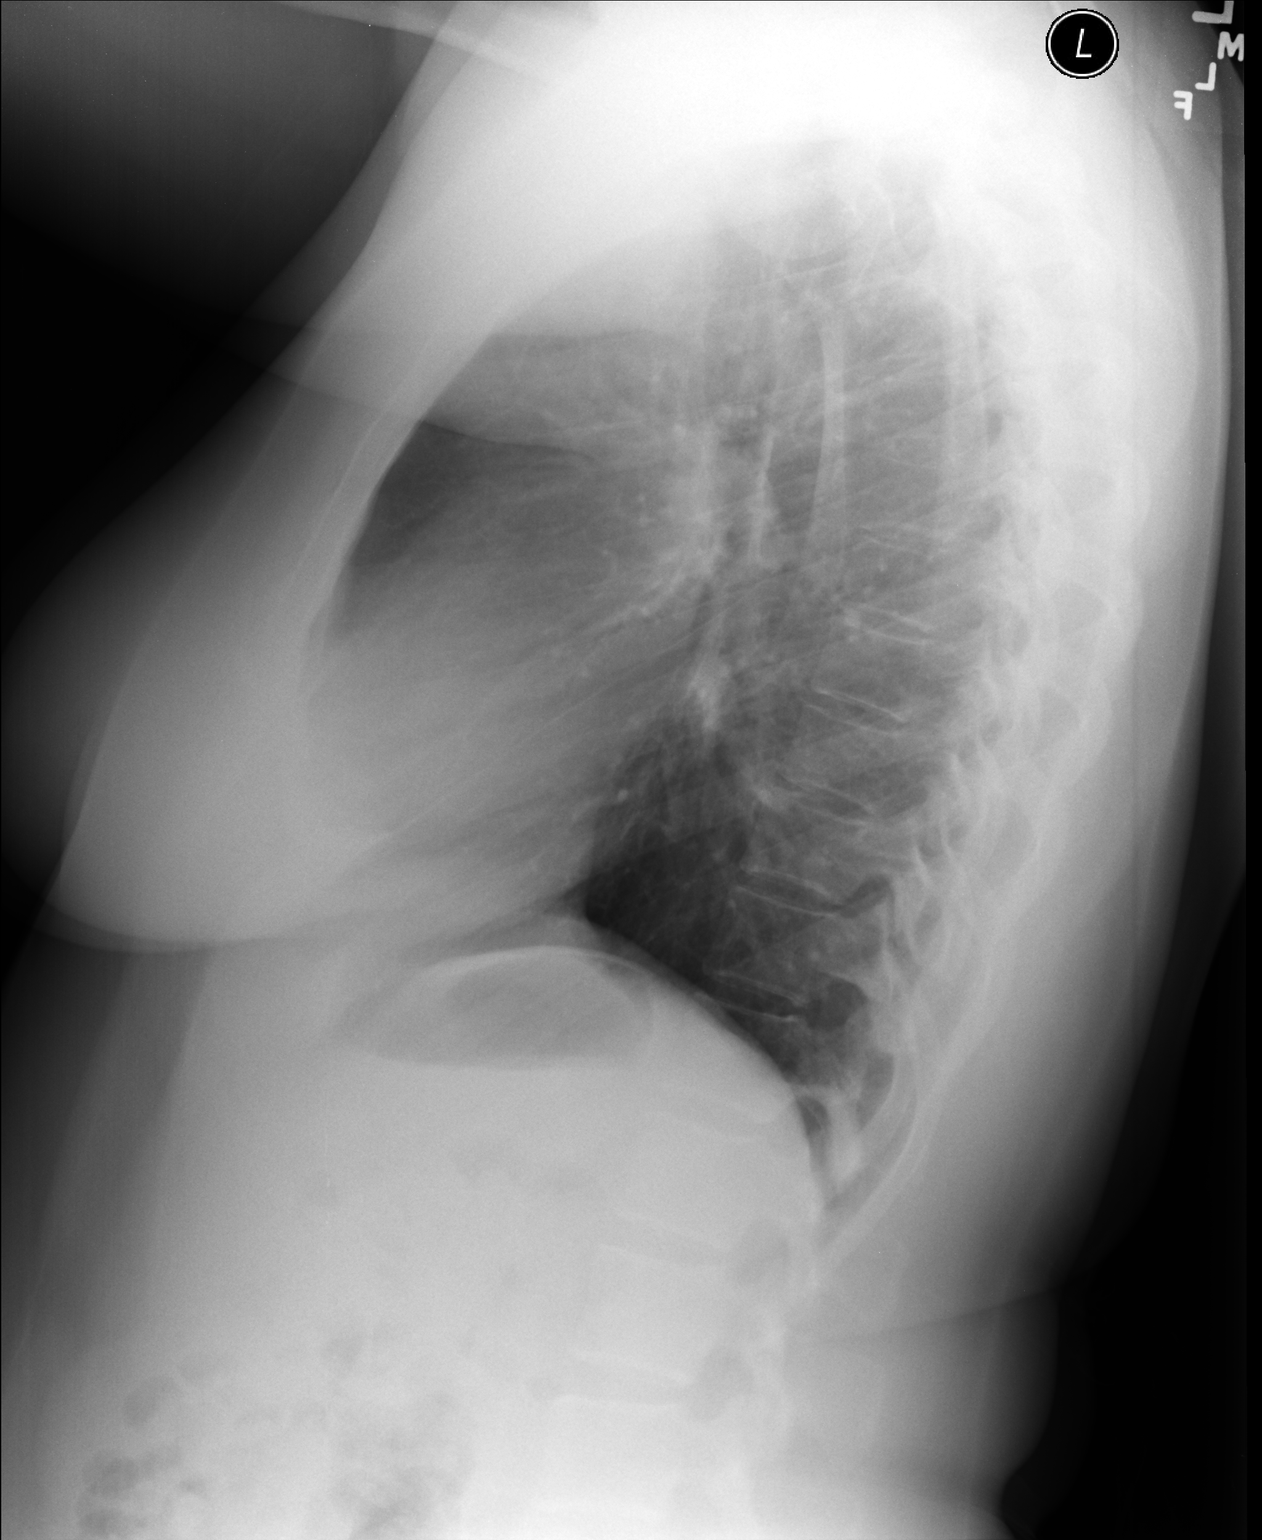

[2 of 2 positions shown; findings below may reference images not displayed]

FINDINGS: The heart size and mediastinal contours are within normal limits.
Both lungs are clear. The visualized skeletal structures are
unremarkable.
IMPRESSION: No active cardiopulmonary disease.

## 2022-10-25 ENCOUNTER — Other Ambulatory Visit: Payer: Self-pay | Admitting: Internal Medicine

## 2022-10-25 DIAGNOSIS — Z1231 Encounter for screening mammogram for malignant neoplasm of breast: Secondary | ICD-10-CM

## 2024-01-22 ENCOUNTER — Other Ambulatory Visit: Payer: Self-pay

## 2024-01-22 ENCOUNTER — Emergency Department (HOSPITAL_COMMUNITY)
Admission: EM | Admit: 2024-01-22 | Discharge: 2024-01-23 | Discharge status: Against Medical Advice | Attending: Emergency Medicine | Admitting: Emergency Medicine

## 2024-01-22 DIAGNOSIS — R197 Diarrhea, unspecified: Secondary | ICD-10-CM | POA: Insufficient documentation

## 2024-01-22 DIAGNOSIS — R112 Nausea with vomiting, unspecified: Secondary | ICD-10-CM | POA: Diagnosis not present

## 2024-01-22 DIAGNOSIS — Z5321 Procedure and treatment not carried out due to patient leaving prior to being seen by health care provider: Secondary | ICD-10-CM | POA: Insufficient documentation

## 2024-01-22 DIAGNOSIS — R109 Unspecified abdominal pain: Secondary | ICD-10-CM | POA: Diagnosis present

## 2024-01-22 LAB — URINALYSIS, ROUTINE W REFLEX MICROSCOPIC
Bilirubin Urine: NEGATIVE
Glucose, UA: NEGATIVE mg/dL
Hgb urine dipstick: NEGATIVE
Ketones, ur: NEGATIVE mg/dL
Leukocytes,Ua: NEGATIVE
Nitrite: NEGATIVE
Protein, ur: NEGATIVE mg/dL
Specific Gravity, Urine: 1.019 (ref 1.005–1.030)
pH: 5 (ref 5.0–8.0)

## 2024-01-22 LAB — CBC WITH DIFFERENTIAL/PLATELET
Abs Immature Granulocytes: 0.05 10*3/uL (ref 0.00–0.07)
Basophils Absolute: 0.1 10*3/uL (ref 0.0–0.1)
Basophils Relative: 1 %
Eosinophils Absolute: 0 10*3/uL (ref 0.0–0.5)
Eosinophils Relative: 0 %
HCT: 40.1 % (ref 36.0–46.0)
Hemoglobin: 12.2 g/dL (ref 12.0–15.0)
Immature Granulocytes: 0 %
Lymphocytes Relative: 22 %
Lymphs Abs: 2.6 10*3/uL (ref 0.7–4.0)
MCH: 25.6 pg — ABNORMAL LOW (ref 26.0–34.0)
MCHC: 30.4 g/dL (ref 30.0–36.0)
MCV: 84.1 fL (ref 80.0–100.0)
Monocytes Absolute: 0.6 10*3/uL (ref 0.1–1.0)
Monocytes Relative: 5 %
Neutro Abs: 8.3 10*3/uL — ABNORMAL HIGH (ref 1.7–7.7)
Neutrophils Relative %: 72 %
Platelets: 402 10*3/uL — ABNORMAL HIGH (ref 150–400)
RBC: 4.77 MIL/uL (ref 3.87–5.11)
RDW: 15.4 % (ref 11.5–15.5)
WBC: 11.6 10*3/uL — ABNORMAL HIGH (ref 4.0–10.5)
nRBC: 0 % (ref 0.0–0.2)

## 2024-01-22 LAB — COMPREHENSIVE METABOLIC PANEL WITH GFR
ALT: 26 U/L (ref 0–44)
AST: 23 U/L (ref 15–41)
Albumin: 3.7 g/dL (ref 3.5–5.0)
Alkaline Phosphatase: 57 U/L (ref 38–126)
Anion gap: 11 (ref 5–15)
BUN: 23 mg/dL — ABNORMAL HIGH (ref 6–20)
CO2: 27 mmol/L (ref 22–32)
Calcium: 9.7 mg/dL (ref 8.9–10.3)
Chloride: 101 mmol/L (ref 98–111)
Creatinine, Ser: 0.98 mg/dL (ref 0.44–1.00)
GFR, Estimated: >60 mL/min (ref >60)
Glucose, Bld: 98 mg/dL (ref 70–99)
Potassium: 3.5 mmol/L (ref 3.5–5.1)
Sodium: 139 mmol/L (ref 135–145)
Total Bilirubin: 0.4 mg/dL (ref 0.0–1.2)
Total Protein: 7.6 g/dL (ref 6.5–8.1)

## 2024-01-22 LAB — MAGNESIUM: Magnesium: 1.9 mg/dL (ref 1.7–2.4)

## 2024-01-22 LAB — LIPASE, BLOOD: Lipase: 39 U/L (ref 11–51)

## 2024-01-22 MED ORDER — ONDANSETRON 4 MG PO TBDP
4.0000 mg | ORAL_TABLET | Freq: Once | ORAL | Status: AC
  Administered 2024-01-22: 4 mg via ORAL
  Filled 2024-01-22: qty 1

## 2024-01-22 NOTE — ED Triage Notes (Signed)
 Patient reports mid abdominal pain with emesis and diarrhea onset 4 days ago , no fever or chills .

## 2024-01-22 NOTE — ED Provider Triage Note (Signed)
 Emergency Medicine Provider Triage Evaluation Note  Tonya Saunders , a 58 y.o. female  was evaluated in triage.  Pt complains of abdominal pain, nausea, vomiting, diarrhea.  Reports the symptoms been ongoing for the last 4 days.  Recent return from a trip to the medical [place] and is unsure if she may be in fact with some illness while she was there.  Denies any obvious sick contacts as far she knows.  No reported hematemesis, hematochezia, or melanotic stools.  States that she is struggling to tolerate p.o. over last 4 days.  Has tried taking azithromycin  that she bought while in the [place] to try to manage her symptoms with no improvement.  Denies any other recent medications or interventions.  Review of Systems  Positive: As above Negative: As above  Physical Exam  BP (!) 156/93 (BP Location: Right Arm)   Pulse 93   Temp 98.2 F (36.8 C)   Resp 18   SpO2 100%  Gen:   Awake, no distress   Resp:  Normal effort  MSK:   Moves extremities without difficulty  Other:  Generalized and nonfocal abdominal tenderness.  Normal bowel sounds.  Delayed capillary refill at 2 to 3 seconds suggestive of likely dehydration.  Medical Decision Making  Medically screening exam initiated at 9:39 PM.  Appropriate orders placed.  Rei D Koble was informed that the remainder of the evaluation will be completed by another provider, this initial triage assessment does not replace that evaluation, and the importance of remaining in the ED until their evaluation is complete.     Dymir Neeson A, PA-C 01/22/24 2140

## 2024-01-23 MED ORDER — OXYCODONE-ACETAMINOPHEN 5-325 MG PO TABS
1.0000 | ORAL_TABLET | Freq: Once | ORAL | Status: AC
  Administered 2024-01-23: 1 via ORAL
  Filled 2024-01-23: qty 1

## 2024-01-23 NOTE — ED Notes (Signed)
 Pt stated she has waited too long and has to go because she has to work tomorrow.
